# Patient Record
Sex: Male | Born: 1980 | Race: White | Hispanic: No | Marital: Single | Smoking: Current every day smoker
Health system: Southern US, Community
[De-identification: ages and names within clinical notes are randomized; demographics above are authoritative.]

## PROBLEM LIST (undated history)

## (undated) DIAGNOSIS — G959 Disease of spinal cord, unspecified: Secondary | ICD-10-CM

## (undated) HISTORY — DX: Disease of spinal cord, unspecified: G95.9

## (undated) HISTORY — PX: INNER EAR SURGERY: SHX679

---

## 2010-10-24 HISTORY — PX: OTHER SURGICAL HISTORY: SHX169

## 2011-03-14 ENCOUNTER — Emergency Department (HOSPITAL_COMMUNITY): Payer: Medicaid Other

## 2011-03-14 ENCOUNTER — Inpatient Hospital Stay (HOSPITAL_COMMUNITY)
Admission: EM | Admit: 2011-03-14 | Discharge: 2011-04-06 | DRG: 003 | Disposition: A | Discharge status: Rehabilitation Facility | Payer: Medicaid Other | Attending: General Surgery | Admitting: General Surgery

## 2011-03-14 DIAGNOSIS — S27329A Contusion of lung, unspecified, initial encounter: Secondary | ICD-10-CM | POA: Diagnosis present

## 2011-03-14 DIAGNOSIS — S12600A Unspecified displaced fracture of seventh cervical vertebra, initial encounter for closed fracture: Secondary | ICD-10-CM | POA: Diagnosis present

## 2011-03-14 DIAGNOSIS — S0280XA Fracture of other specified skull and facial bones, unspecified side, initial encounter for closed fracture: Secondary | ICD-10-CM | POA: Diagnosis present

## 2011-03-14 DIAGNOSIS — J95821 Acute postprocedural respiratory failure: Secondary | ICD-10-CM | POA: Diagnosis present

## 2011-03-14 DIAGNOSIS — Z781 Physical restraint status: Secondary | ICD-10-CM | POA: Diagnosis not present

## 2011-03-14 DIAGNOSIS — S02400A Malar fracture unspecified, initial encounter for closed fracture: Secondary | ICD-10-CM | POA: Diagnosis present

## 2011-03-14 DIAGNOSIS — J15211 Pneumonia due to Methicillin susceptible Staphylococcus aureus: Secondary | ICD-10-CM | POA: Diagnosis not present

## 2011-03-14 DIAGNOSIS — S02609A Fracture of mandible, unspecified, initial encounter for closed fracture: Secondary | ICD-10-CM | POA: Diagnosis present

## 2011-03-14 DIAGNOSIS — S0230XA Fracture of orbital floor, unspecified side, initial encounter for closed fracture: Secondary | ICD-10-CM | POA: Diagnosis present

## 2011-03-14 DIAGNOSIS — S022XXA Fracture of nasal bones, initial encounter for closed fracture: Secondary | ICD-10-CM | POA: Diagnosis present

## 2011-03-14 DIAGNOSIS — Y998 Other external cause status: Secondary | ICD-10-CM

## 2011-03-14 DIAGNOSIS — R1310 Dysphagia, unspecified: Secondary | ICD-10-CM | POA: Diagnosis not present

## 2011-03-14 DIAGNOSIS — S02109A Fracture of base of skull, unspecified side, initial encounter for closed fracture: Principal | ICD-10-CM | POA: Diagnosis present

## 2011-03-14 DIAGNOSIS — E876 Hypokalemia: Secondary | ICD-10-CM | POA: Diagnosis not present

## 2011-03-14 DIAGNOSIS — F17200 Nicotine dependence, unspecified, uncomplicated: Secondary | ICD-10-CM | POA: Diagnosis present

## 2011-03-14 DIAGNOSIS — S02401A Maxillary fracture, unspecified, initial encounter for closed fracture: Secondary | ICD-10-CM | POA: Diagnosis present

## 2011-03-14 DIAGNOSIS — D62 Acute posthemorrhagic anemia: Secondary | ICD-10-CM | POA: Diagnosis present

## 2011-03-14 DIAGNOSIS — R561 Post traumatic seizures: Secondary | ICD-10-CM | POA: Diagnosis present

## 2011-03-14 LAB — COMPREHENSIVE METABOLIC PANEL
ALT: 51 U/L (ref 0–53)
AST: 45 U/L — ABNORMAL HIGH (ref 0–37)
Albumin: 4 g/dL (ref 3.5–5.2)
Alkaline Phosphatase: 73 U/L (ref 39–117)
BUN: 17 mg/dL (ref 6–23)
CO2: 24 meq/L (ref 19–32)
Calcium: 8.9 mg/dL (ref 8.4–10.5)
Chloride: 103 meq/L (ref 96–112)
Creatinine, Ser: 1.13 mg/dL (ref 0.4–1.5)
Glucose, Bld: 203 mg/dL — ABNORMAL HIGH (ref 70–99)
Potassium: 2.7 meq/L — CL (ref 3.5–5.1)
Sodium: 140 meq/L (ref 135–145)
Total Bilirubin: 0.2 mg/dL — ABNORMAL LOW (ref 0.3–1.2)
Total Protein: 6.7 g/dL (ref 6.0–8.3)

## 2011-03-14 LAB — CBC
HCT: 42.3 % (ref 39.0–52.0)
Hemoglobin: 15.1 g/dL (ref 13.0–17.0)
MCV: 86.2 fL (ref 78.0–100.0)
RBC: 4.91 MIL/uL (ref 4.22–5.81)
WBC: 20.8 10*3/uL — ABNORMAL HIGH (ref 4.0–10.5)

## 2011-03-14 LAB — POCT I-STAT, CHEM 8
BUN: 17 mg/dL (ref 6–23)
Calcium, Ion: 1.06 mmol/L — ABNORMAL LOW (ref 1.12–1.32)
Chloride: 105 meq/L (ref 96–112)
HCT: 44 % (ref 39.0–52.0)
Potassium: 2.9 meq/L — ABNORMAL LOW (ref 3.5–5.1)

## 2011-03-14 LAB — PROTIME-INR
INR: 1.05 (ref 0.00–1.49)
Prothrombin Time: 13.9 s (ref 11.6–15.2)

## 2011-03-14 LAB — POCT I-STAT 3, ART BLOOD GAS (G3+)
Acid-base deficit: 2 mmol/L (ref 0.0–2.0)
Bicarbonate: 23.5 meq/L (ref 20.0–24.0)
O2 Saturation: 80 %
TCO2: 25 mmol/L (ref 0–100)
pCO2 arterial: 43.3 mmHg (ref 35.0–45.0)
pH, Arterial: 7.343 — ABNORMAL LOW (ref 7.350–7.450)
pO2, Arterial: 47 mmHg — ABNORMAL LOW (ref 80.0–100.0)

## 2011-03-14 LAB — ABO/RH: ABO/RH(D): O POS

## 2011-03-14 LAB — LACTIC ACID, PLASMA: Lactic Acid, Venous: 2.9 mmol/L — ABNORMAL HIGH (ref 0.5–2.2)

## 2011-03-14 MED ORDER — IOHEXOL 300 MG/ML  SOLN
100.0000 mL | Freq: Once | INTRAMUSCULAR | Status: AC | PRN
  Administered 2011-03-14: 100 mL via INTRAVENOUS

## 2011-03-14 MED ORDER — IOHEXOL 350 MG/ML SOLN
50.0000 mL | Freq: Once | INTRAVENOUS | Status: AC | PRN
  Administered 2011-03-14: 50 mL via INTRAVENOUS

## 2011-03-15 ENCOUNTER — Inpatient Hospital Stay (HOSPITAL_COMMUNITY): Payer: Medicaid Other

## 2011-03-15 LAB — POCT I-STAT 3, ART BLOOD GAS (G3+)
Acid-base deficit: 1 mmol/L (ref 0.0–2.0)
Bicarbonate: 25.1 meq/L — ABNORMAL HIGH (ref 20.0–24.0)
O2 Saturation: 100 %
Patient temperature: 38.2
TCO2: 26 mmol/L (ref 0–100)
pCO2 arterial: 48.4 mmHg — ABNORMAL HIGH (ref 35.0–45.0)
pH, Arterial: 7.328 — ABNORMAL LOW (ref 7.350–7.450)
pO2, Arterial: 353 mmHg — ABNORMAL HIGH (ref 80.0–100.0)

## 2011-03-15 LAB — BASIC METABOLIC PANEL
CO2: 24 mEq/L (ref 19–32)
Calcium: 8.6 mg/dL (ref 8.4–10.5)
Creatinine, Ser: 0.83 mg/dL (ref 0.4–1.5)
Glucose, Bld: 129 mg/dL — ABNORMAL HIGH (ref 70–99)

## 2011-03-15 LAB — TYPE AND SCREEN
ABO/RH(D): O POS
Antibody Screen: NEGATIVE
Unit division: 0
Unit division: 0

## 2011-03-15 LAB — CBC
HCT: 38.8 % — ABNORMAL LOW (ref 39.0–52.0)
Hemoglobin: 13.6 g/dL (ref 13.0–17.0)
MCH: 30.5 pg (ref 26.0–34.0)
MCHC: 35.1 g/dL (ref 30.0–36.0)
RDW: 12.2 % (ref 11.5–15.5)

## 2011-03-15 LAB — GLUCOSE, CAPILLARY: Glucose-Capillary: 169 mg/dL — ABNORMAL HIGH (ref 70–99)

## 2011-03-16 LAB — POCT I-STAT 3, ART BLOOD GAS (G3+)
Bicarbonate: 25.9 meq/L — ABNORMAL HIGH (ref 20.0–24.0)
O2 Saturation: 99 %
Patient temperature: 37.2
TCO2: 27 mmol/L (ref 0–100)
pO2, Arterial: 126 mmHg — ABNORMAL HIGH (ref 80.0–100.0)

## 2011-03-16 LAB — DIFFERENTIAL
Eosinophils Absolute: 0 10*3/uL (ref 0.0–0.7)
Eosinophils Relative: 0 % (ref 0–5)
Lymphs Abs: 0.7 10*3/uL (ref 0.7–4.0)
Monocytes Absolute: 0.7 10*3/uL (ref 0.1–1.0)
Monocytes Relative: 5 % (ref 3–12)

## 2011-03-16 LAB — CBC
MCH: 30.2 pg (ref 26.0–34.0)
MCV: 87.3 fL (ref 78.0–100.0)
Platelets: 143 10*3/uL — ABNORMAL LOW (ref 150–400)
RBC: 4.24 MIL/uL (ref 4.22–5.81)
RDW: 12.1 % (ref 11.5–15.5)

## 2011-03-16 LAB — BASIC METABOLIC PANEL
BUN: 11 mg/dL (ref 6–23)
Chloride: 100 mEq/L (ref 96–112)
Creatinine, Ser: 0.64 mg/dL (ref 0.4–1.5)

## 2011-03-17 ENCOUNTER — Inpatient Hospital Stay (HOSPITAL_COMMUNITY): Payer: Medicaid Other

## 2011-03-17 LAB — BASIC METABOLIC PANEL
CO2: 27 mEq/L (ref 19–32)
Chloride: 96 mEq/L (ref 96–112)
Sodium: 132 mEq/L — ABNORMAL LOW (ref 135–145)

## 2011-03-17 LAB — CBC
Hemoglobin: 11.9 g/dL — ABNORMAL LOW (ref 13.0–17.0)
RBC: 3.9 MIL/uL — ABNORMAL LOW (ref 4.22–5.81)
WBC: 10.7 10*3/uL — ABNORMAL HIGH (ref 4.0–10.5)

## 2011-03-17 LAB — DIFFERENTIAL
Basophils Absolute: 0 10*3/uL (ref 0.0–0.1)
Basophils Relative: 0 % (ref 0–1)
Monocytes Relative: 7 % (ref 3–12)
Neutro Abs: 9.2 10*3/uL — ABNORMAL HIGH (ref 1.7–7.7)
Neutrophils Relative %: 86 % — ABNORMAL HIGH (ref 43–77)

## 2011-03-17 LAB — BLOOD GAS, ARTERIAL
Acid-Base Excess: 2.6 mmol/L — ABNORMAL HIGH (ref 0.0–2.0)
Drawn by: 31276
FIO2: 0.4 %
RATE: 15 resp/min
pCO2 arterial: 47 mmHg — ABNORMAL HIGH (ref 35.0–45.0)
pO2, Arterial: 110 mmHg — ABNORMAL HIGH (ref 80.0–100.0)

## 2011-03-18 ENCOUNTER — Inpatient Hospital Stay (HOSPITAL_COMMUNITY): Payer: Medicaid Other

## 2011-03-18 LAB — BASIC METABOLIC PANEL
CO2: 30 mEq/L (ref 19–32)
Calcium: 8.1 mg/dL — ABNORMAL LOW (ref 8.4–10.5)
Chloride: 98 mEq/L (ref 96–112)
Glucose, Bld: 131 mg/dL — ABNORMAL HIGH (ref 70–99)
Sodium: 136 mEq/L (ref 135–145)

## 2011-03-18 LAB — CBC
HCT: 32.6 % — ABNORMAL LOW (ref 39.0–52.0)
Hemoglobin: 11.2 g/dL — ABNORMAL LOW (ref 13.0–17.0)
MCH: 30.2 pg (ref 26.0–34.0)
MCHC: 34.4 g/dL (ref 30.0–36.0)
RBC: 3.71 MIL/uL — ABNORMAL LOW (ref 4.22–5.81)

## 2011-03-18 LAB — POCT I-STAT 3, ART BLOOD GAS (G3+)
Bicarbonate: 28 meq/L — ABNORMAL HIGH (ref 20.0–24.0)
O2 Saturation: 98 %
TCO2: 29 mmol/L (ref 0–100)
pCO2 arterial: 43.8 mmHg (ref 35.0–45.0)

## 2011-03-18 LAB — DIFFERENTIAL
Basophils Absolute: 0 10*3/uL (ref 0.0–0.1)
Lymphocytes Relative: 7 % — ABNORMAL LOW (ref 12–46)
Monocytes Absolute: 0.7 10*3/uL (ref 0.1–1.0)
Monocytes Relative: 9 % (ref 3–12)
Neutro Abs: 6.6 10*3/uL (ref 1.7–7.7)
Neutrophils Relative %: 84 % — ABNORMAL HIGH (ref 43–77)

## 2011-03-18 LAB — GLUCOSE, CAPILLARY
Glucose-Capillary: 126 mg/dL — ABNORMAL HIGH (ref 70–99)
Glucose-Capillary: 135 mg/dL — ABNORMAL HIGH (ref 70–99)

## 2011-03-18 LAB — MAGNESIUM: Magnesium: 2.3 mg/dL (ref 1.5–2.5)

## 2011-03-19 LAB — PHOSPHORUS: Phosphorus: 1.6 mg/dL — ABNORMAL LOW (ref 2.3–4.6)

## 2011-03-19 LAB — COMPREHENSIVE METABOLIC PANEL
AST: 22 U/L (ref 0–37)
CO2: 28 mEq/L (ref 19–32)
Calcium: 8.2 mg/dL — ABNORMAL LOW (ref 8.4–10.5)
Creatinine, Ser: 0.5 mg/dL (ref 0.4–1.5)
GFR calc Af Amer: >60 mL/min (ref >60)
GFR calc non Af Amer: >60 mL/min (ref >60)

## 2011-03-19 LAB — DIFFERENTIAL
Basophils Relative: 0 % (ref 0–1)
Eosinophils Absolute: 0.2 10*3/uL (ref 0.0–0.7)
Monocytes Absolute: 0.7 10*3/uL (ref 0.1–1.0)
Monocytes Relative: 9 % (ref 3–12)

## 2011-03-19 LAB — CULTURE, RESPIRATORY W GRAM STAIN

## 2011-03-19 LAB — CBC
Hemoglobin: 9.7 g/dL — ABNORMAL LOW (ref 13.0–17.0)
MCH: 30.4 pg (ref 26.0–34.0)
MCHC: 34.5 g/dL (ref 30.0–36.0)
Platelets: 169 10*3/uL (ref 150–400)

## 2011-03-19 LAB — MAGNESIUM: Magnesium: 2.4 mg/dL (ref 1.5–2.5)

## 2011-03-19 LAB — PREALBUMIN: Prealbumin: 8 mg/dL — ABNORMAL LOW (ref 17.0–34.0)

## 2011-03-19 LAB — TRIGLYCERIDES: Triglycerides: 83 mg/dL (ref <150)

## 2011-03-20 ENCOUNTER — Inpatient Hospital Stay (HOSPITAL_COMMUNITY): Payer: Medicaid Other

## 2011-03-20 LAB — CBC
HCT: 30 % — ABNORMAL LOW (ref 39.0–52.0)
Hemoglobin: 10.3 g/dL — ABNORMAL LOW (ref 13.0–17.0)
RBC: 3.48 MIL/uL — ABNORMAL LOW (ref 4.22–5.81)
WBC: 7.5 10*3/uL (ref 4.0–10.5)

## 2011-03-20 LAB — BASIC METABOLIC PANEL
CO2: 28 mEq/L (ref 19–32)
Chloride: 104 mEq/L (ref 96–112)
Potassium: 3.2 mEq/L — ABNORMAL LOW (ref 3.5–5.1)
Sodium: 139 mEq/L (ref 135–145)

## 2011-03-20 LAB — GLUCOSE, CAPILLARY
Glucose-Capillary: 131 mg/dL — ABNORMAL HIGH (ref 70–99)
Glucose-Capillary: 115 mg/dL — ABNORMAL HIGH (ref 70–99)

## 2011-03-20 LAB — PHOSPHORUS: Phosphorus: 3.7 mg/dL (ref 2.3–4.6)

## 2011-03-21 ENCOUNTER — Inpatient Hospital Stay (HOSPITAL_COMMUNITY): Payer: Medicaid Other

## 2011-03-21 LAB — COMPREHENSIVE METABOLIC PANEL
AST: 45 U/L — ABNORMAL HIGH (ref 0–37)
Albumin: 2.9 g/dL — ABNORMAL LOW (ref 3.5–5.2)
BUN: 13 mg/dL (ref 6–23)
Calcium: 9.1 mg/dL (ref 8.4–10.5)
Creatinine, Ser: 0.49 mg/dL (ref 0.4–1.5)
GFR calc Af Amer: >60 mL/min (ref >60)
GFR calc non Af Amer: >60 mL/min (ref >60)

## 2011-03-21 LAB — DIFFERENTIAL
Basophils Relative: 0 % (ref 0–1)
Eosinophils Absolute: 0.2 10*3/uL (ref 0.0–0.7)
Eosinophils Relative: 3 % (ref 0–5)
Monocytes Absolute: 1 10*3/uL (ref 0.1–1.0)
Monocytes Relative: 11 % (ref 3–12)

## 2011-03-21 LAB — GLUCOSE, CAPILLARY
Glucose-Capillary: 128 mg/dL — ABNORMAL HIGH (ref 70–99)
Glucose-Capillary: 137 mg/dL — ABNORMAL HIGH (ref 70–99)
Glucose-Capillary: 114 mg/dL — ABNORMAL HIGH (ref 70–99)
Glucose-Capillary: 149 mg/dL — ABNORMAL HIGH (ref 70–99)

## 2011-03-21 LAB — CBC
MCH: 30.3 pg (ref 26.0–34.0)
MCHC: 35.4 g/dL (ref 30.0–36.0)
MCV: 85.6 fL (ref 78.0–100.0)
RDW: 11.6 % (ref 11.5–15.5)

## 2011-03-21 LAB — PHOSPHORUS: Phosphorus: 5.5 mg/dL — ABNORMAL HIGH (ref 2.3–4.6)

## 2011-03-22 ENCOUNTER — Inpatient Hospital Stay (HOSPITAL_COMMUNITY): Payer: Medicaid Other

## 2011-03-22 LAB — POCT I-STAT 4, (NA,K, GLUC, HGB,HCT)
Potassium: 4.3 meq/L (ref 3.5–5.1)
Sodium: 136 meq/L (ref 135–145)

## 2011-03-22 LAB — BASIC METABOLIC PANEL
CO2: 28 mEq/L (ref 19–32)
Calcium: 9.1 mg/dL (ref 8.4–10.5)
Chloride: 99 mEq/L (ref 96–112)
GFR calc Af Amer: >60 mL/min (ref >60)
Glucose, Bld: 165 mg/dL — ABNORMAL HIGH (ref 70–99)
Potassium: 4.2 mEq/L (ref 3.5–5.1)
Sodium: 136 mEq/L (ref 135–145)

## 2011-03-22 LAB — CBC
HCT: 36.4 % — ABNORMAL LOW (ref 39.0–52.0)
Hemoglobin: 12.8 g/dL — ABNORMAL LOW (ref 13.0–17.0)
MCHC: 35.2 g/dL (ref 30.0–36.0)
RBC: 4.29 MIL/uL (ref 4.22–5.81)

## 2011-03-22 LAB — GLUCOSE, CAPILLARY
Glucose-Capillary: 152 mg/dL — ABNORMAL HIGH (ref 70–99)
Glucose-Capillary: 159 mg/dL — ABNORMAL HIGH (ref 70–99)

## 2011-03-22 LAB — PHOSPHORUS: Phosphorus: 4.4 mg/dL (ref 2.3–4.6)

## 2011-03-23 LAB — CBC
MCH: 30.3 pg (ref 26.0–34.0)
MCHC: 35.4 g/dL (ref 30.0–36.0)
MCV: 85.6 fL (ref 78.0–100.0)
Platelets: 417 10*3/uL — ABNORMAL HIGH (ref 150–400)
RBC: 4.32 MIL/uL (ref 4.22–5.81)

## 2011-03-23 LAB — GLUCOSE, CAPILLARY
Glucose-Capillary: 147 mg/dL — ABNORMAL HIGH (ref 70–99)
Glucose-Capillary: 150 mg/dL — ABNORMAL HIGH (ref 70–99)
Glucose-Capillary: 155 mg/dL — ABNORMAL HIGH (ref 70–99)

## 2011-03-23 LAB — PHOSPHORUS: Phosphorus: 4.2 mg/dL (ref 2.3–4.6)

## 2011-03-23 LAB — MAGNESIUM: Magnesium: 2.5 mg/dL (ref 1.5–2.5)

## 2011-03-23 LAB — BASIC METABOLIC PANEL
BUN: 19 mg/dL (ref 6–23)
Calcium: 9.1 mg/dL (ref 8.4–10.5)
Chloride: 97 mEq/L (ref 96–112)
Creatinine, Ser: 0.55 mg/dL (ref 0.4–1.5)
GFR calc Af Amer: >60 mL/min (ref >60)
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
## 2011-03-24 LAB — COMPREHENSIVE METABOLIC PANEL
AST: 45 U/L — ABNORMAL HIGH (ref 0–37)
Albumin: 3.4 g/dL — ABNORMAL LOW (ref 3.5–5.2)
BUN: 20 mg/dL (ref 6–23)
Chloride: 99 mEq/L (ref 96–112)
Creatinine, Ser: 0.66 mg/dL (ref 0.4–1.5)
GFR calc Af Amer: >60 mL/min (ref >60)
GFR calc non Af Amer: >60 mL/min (ref >60)
Potassium: 3.8 mEq/L (ref 3.5–5.1)
Total Protein: 7.1 g/dL (ref 6.0–8.3)

## 2011-03-24 LAB — DIFFERENTIAL
Basophils Relative: 0 % (ref 0–1)
Eosinophils Absolute: 0.2 10*3/uL (ref 0.0–0.7)
Eosinophils Relative: 1 % (ref 0–5)
Neutrophils Relative %: 73 % (ref 43–77)

## 2011-03-24 LAB — CBC
MCH: 29.8 pg (ref 26.0–34.0)
MCV: 85.7 fL (ref 78.0–100.0)
Platelets: 502 10*3/uL — ABNORMAL HIGH (ref 150–400)
RBC: 4.2 MIL/uL — ABNORMAL LOW (ref 4.22–5.81)
RDW: 11.8 % (ref 11.5–15.5)
WBC: 14.6 10*3/uL — ABNORMAL HIGH (ref 4.0–10.5)

## 2011-03-24 LAB — MAGNESIUM: Magnesium: 2.5 mg/dL (ref 1.5–2.5)

## 2011-03-24 LAB — GLUCOSE, CAPILLARY

## 2011-03-25 DIAGNOSIS — S069X9A Unspecified intracranial injury with loss of consciousness of unspecified duration, initial encounter: Secondary | ICD-10-CM

## 2011-03-25 LAB — GLUCOSE, CAPILLARY
Glucose-Capillary: 152 mg/dL — ABNORMAL HIGH (ref 70–99)
Glucose-Capillary: 152 mg/dL — ABNORMAL HIGH (ref 70–99)

## 2011-03-26 LAB — GLUCOSE, CAPILLARY
Glucose-Capillary: 133 mg/dL — ABNORMAL HIGH (ref 70–99)
Glucose-Capillary: 135 mg/dL — ABNORMAL HIGH (ref 70–99)

## 2011-03-26 LAB — BASIC METABOLIC PANEL
CO2: 29 mEq/L (ref 19–32)
Glucose, Bld: 166 mg/dL — ABNORMAL HIGH (ref 70–99)
Potassium: 3.7 mEq/L (ref 3.5–5.1)
Sodium: 139 mEq/L (ref 135–145)

## 2011-03-27 LAB — GLUCOSE, CAPILLARY

## 2011-03-28 LAB — COMPREHENSIVE METABOLIC PANEL
AST: 26 U/L (ref 0–37)
Albumin: 3.2 g/dL — ABNORMAL LOW (ref 3.5–5.2)
Chloride: 102 mEq/L (ref 96–112)
Creatinine, Ser: 0.58 mg/dL (ref 0.4–1.5)
GFR calc Af Amer: >60 mL/min (ref >60)
Total Bilirubin: 0.5 mg/dL (ref 0.3–1.2)
Total Protein: 6.9 g/dL (ref 6.0–8.3)

## 2011-03-28 LAB — CBC
MCH: 30.4 pg (ref 26.0–34.0)
Platelets: 552 10*3/uL — ABNORMAL HIGH (ref 150–400)
RBC: 3.91 MIL/uL — ABNORMAL LOW (ref 4.22–5.81)
RDW: 12 % (ref 11.5–15.5)
WBC: 14.1 10*3/uL — ABNORMAL HIGH (ref 4.0–10.5)

## 2011-03-28 LAB — GLUCOSE, CAPILLARY: Glucose-Capillary: 126 mg/dL — ABNORMAL HIGH (ref 70–99)

## 2011-03-28 LAB — DIFFERENTIAL
Basophils Relative: 0 % (ref 0–1)
Eosinophils Absolute: 0.2 10*3/uL (ref 0.0–0.7)
Eosinophils Relative: 1 % (ref 0–5)
Monocytes Relative: 8 % (ref 3–12)
Neutrophils Relative %: 72 % (ref 43–77)

## 2011-03-31 LAB — DIFFERENTIAL
Basophils Absolute: 0 10*3/uL (ref 0.0–0.1)
Lymphocytes Relative: 16 % (ref 12–46)
Lymphs Abs: 1.9 10*3/uL (ref 0.7–4.0)
Monocytes Absolute: 0.9 10*3/uL (ref 0.1–1.0)
Monocytes Relative: 7 % (ref 3–12)
Neutro Abs: 9.4 10*3/uL — ABNORMAL HIGH (ref 1.7–7.7)

## 2011-03-31 LAB — CBC
HCT: 36.6 % — ABNORMAL LOW (ref 39.0–52.0)
Hemoglobin: 12.5 g/dL — ABNORMAL LOW (ref 13.0–17.0)
MCHC: 34.2 g/dL (ref 30.0–36.0)
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
## 2011-04-01 LAB — COMPREHENSIVE METABOLIC PANEL
ALT: 48 U/L (ref 0–53)
Alkaline Phosphatase: 114 U/L (ref 39–117)
CO2: 27 mEq/L (ref 19–32)
Calcium: 8.8 mg/dL (ref 8.4–10.5)
GFR calc non Af Amer: >60 mL/min (ref >60)
Glucose, Bld: 152 mg/dL — ABNORMAL HIGH (ref 70–99)
Potassium: 3.9 mEq/L (ref 3.5–5.1)
Sodium: 136 mEq/L (ref 135–145)

## 2011-04-01 LAB — MAGNESIUM: Magnesium: 2.3 mg/dL (ref 1.5–2.5)

## 2011-04-03 LAB — CBC
MCH: 31.1 pg (ref 26.0–34.0)
MCHC: 35.3 g/dL (ref 30.0–36.0)
Platelets: 461 10*3/uL — ABNORMAL HIGH (ref 150–400)
RDW: 12.4 % (ref 11.5–15.5)

## 2011-04-04 LAB — DIFFERENTIAL
Basophils Relative: 0 % (ref 0–1)
Eosinophils Absolute: 0.2 10*3/uL (ref 0.0–0.7)
Neutrophils Relative %: 71 % (ref 43–77)

## 2011-04-04 LAB — CBC
Platelets: 455 10*3/uL — ABNORMAL HIGH (ref 150–400)
RBC: 4.09 MIL/uL — ABNORMAL LOW (ref 4.22–5.81)
WBC: 10.1 10*3/uL (ref 4.0–10.5)

## 2011-04-04 LAB — COMPREHENSIVE METABOLIC PANEL
ALT: 53 U/L (ref 0–53)
AST: 28 U/L (ref 0–37)
Albumin: 3.3 g/dL — ABNORMAL LOW (ref 3.5–5.2)
CO2: 28 mEq/L (ref 19–32)
Calcium: 9.2 mg/dL (ref 8.4–10.5)
Chloride: 102 mEq/L (ref 96–112)
GFR calc non Af Amer: >60 mL/min (ref >60)
Sodium: 139 mEq/L (ref 135–145)
Total Bilirubin: 0.3 mg/dL (ref 0.3–1.2)

## 2011-04-04 LAB — CHOLESTEROL, TOTAL: Cholesterol: 87 mg/dL (ref 0–200)

## 2011-04-04 LAB — PREALBUMIN: Prealbumin: 29.4 mg/dL (ref 17.0–34.0)

## 2011-04-06 ENCOUNTER — Inpatient Hospital Stay (HOSPITAL_COMMUNITY)
Admission: RE | Admit: 2011-04-06 | Discharge: 2011-04-15 | DRG: 945 | Disposition: A | Discharge status: Home / Self Care | Payer: Medicaid Other | Source: Other Acute Inpatient Hospital | Attending: Physical Medicine & Rehabilitation | Admitting: Physical Medicine & Rehabilitation

## 2011-04-06 DIAGNOSIS — F17200 Nicotine dependence, unspecified, uncomplicated: Secondary | ICD-10-CM | POA: Diagnosis present

## 2011-04-06 DIAGNOSIS — R1310 Dysphagia, unspecified: Secondary | ICD-10-CM | POA: Diagnosis present

## 2011-04-06 DIAGNOSIS — Z5189 Encounter for other specified aftercare: Principal | ICD-10-CM

## 2011-04-06 DIAGNOSIS — S0280XA Fracture of other specified skull and facial bones, unspecified side, initial encounter for closed fracture: Secondary | ICD-10-CM

## 2011-04-06 DIAGNOSIS — Z930 Tracheostomy status: Secondary | ICD-10-CM

## 2011-04-06 DIAGNOSIS — S02609A Fracture of mandible, unspecified, initial encounter for closed fracture: Secondary | ICD-10-CM | POA: Diagnosis present

## 2011-04-06 DIAGNOSIS — S82009A Unspecified fracture of unspecified patella, initial encounter for closed fracture: Secondary | ICD-10-CM | POA: Diagnosis present

## 2011-04-06 DIAGNOSIS — S02401A Maxillary fracture, unspecified, initial encounter for closed fracture: Secondary | ICD-10-CM | POA: Diagnosis present

## 2011-04-06 DIAGNOSIS — Z781 Physical restraint status: Secondary | ICD-10-CM | POA: Diagnosis not present

## 2011-04-06 DIAGNOSIS — S0230XA Fracture of orbital floor, unspecified side, initial encounter for closed fracture: Secondary | ICD-10-CM | POA: Diagnosis present

## 2011-04-06 DIAGNOSIS — S02109A Fracture of base of skull, unspecified side, initial encounter for closed fracture: Secondary | ICD-10-CM | POA: Diagnosis present

## 2011-04-06 DIAGNOSIS — G4700 Insomnia, unspecified: Secondary | ICD-10-CM | POA: Diagnosis present

## 2011-04-06 DIAGNOSIS — S069X9A Unspecified intracranial injury with loss of consciousness of unspecified duration, initial encounter: Secondary | ICD-10-CM

## 2011-04-06 DIAGNOSIS — R110 Nausea: Secondary | ICD-10-CM | POA: Diagnosis present

## 2011-04-07 DIAGNOSIS — S0280XA Fracture of other specified skull and facial bones, unspecified side, initial encounter for closed fracture: Secondary | ICD-10-CM

## 2011-04-07 DIAGNOSIS — S069XAA Unspecified intracranial injury with loss of consciousness status unknown, initial encounter: Secondary | ICD-10-CM

## 2011-04-07 DIAGNOSIS — R1310 Dysphagia, unspecified: Secondary | ICD-10-CM

## 2011-04-07 DIAGNOSIS — S069X9A Unspecified intracranial injury with loss of consciousness of unspecified duration, initial encounter: Secondary | ICD-10-CM

## 2011-04-07 LAB — URINALYSIS, ROUTINE W REFLEX MICROSCOPIC
Hgb urine dipstick: NEGATIVE
Ketones, ur: NEGATIVE mg/dL
Nitrite: NEGATIVE
Urobilinogen, UA: 1 mg/dL (ref 0.0–1.0)

## 2011-04-07 LAB — COMPREHENSIVE METABOLIC PANEL
ALT: 61 U/L — ABNORMAL HIGH (ref 0–53)
AST: 30 U/L (ref 0–37)
Alkaline Phosphatase: 122 U/L — ABNORMAL HIGH (ref 39–117)
CO2: 30 mEq/L (ref 19–32)
Chloride: 99 mEq/L (ref 96–112)
GFR calc Af Amer: >60 mL/min (ref >60)
GFR calc non Af Amer: >60 mL/min (ref >60)
Glucose, Bld: 93 mg/dL (ref 70–99)
Potassium: 3.7 mEq/L (ref 3.5–5.1)
Sodium: 138 mEq/L (ref 135–145)
Total Bilirubin: 0.8 mg/dL (ref 0.3–1.2)

## 2011-04-07 LAB — DIFFERENTIAL
Basophils Absolute: 0 10*3/uL (ref 0.0–0.1)
Basophils Relative: 0 % (ref 0–1)
Monocytes Relative: 7 % (ref 3–12)
Neutro Abs: 4.3 10*3/uL (ref 1.7–7.7)
Neutrophils Relative %: 57 % (ref 43–77)

## 2011-04-07 LAB — CBC
Hemoglobin: 12.7 g/dL — ABNORMAL LOW (ref 13.0–17.0)
MCH: 30.4 pg (ref 26.0–34.0)
RBC: 4.18 MIL/uL — ABNORMAL LOW (ref 4.22–5.81)
WBC: 7.5 10*3/uL (ref 4.0–10.5)

## 2011-04-08 DIAGNOSIS — S069X9A Unspecified intracranial injury with loss of consciousness of unspecified duration, initial encounter: Secondary | ICD-10-CM

## 2011-04-08 DIAGNOSIS — S0280XA Fracture of other specified skull and facial bones, unspecified side, initial encounter for closed fracture: Secondary | ICD-10-CM

## 2011-04-08 DIAGNOSIS — R1310 Dysphagia, unspecified: Secondary | ICD-10-CM

## 2011-04-09 LAB — URINE CULTURE: Culture: NO GROWTH

## 2011-04-11 ENCOUNTER — Inpatient Hospital Stay (HOSPITAL_COMMUNITY): Payer: Medicaid Other

## 2011-04-11 DIAGNOSIS — S069X9A Unspecified intracranial injury with loss of consciousness of unspecified duration, initial encounter: Secondary | ICD-10-CM

## 2011-04-11 DIAGNOSIS — R1310 Dysphagia, unspecified: Secondary | ICD-10-CM

## 2011-04-11 DIAGNOSIS — S0280XA Fracture of other specified skull and facial bones, unspecified side, initial encounter for closed fracture: Secondary | ICD-10-CM

## 2011-04-11 LAB — BASIC METABOLIC PANEL
CO2: 29 mEq/L (ref 19–32)
Calcium: 9.2 mg/dL (ref 8.4–10.5)
Chloride: 102 mEq/L (ref 96–112)
Glucose, Bld: 130 mg/dL — ABNORMAL HIGH (ref 70–99)
Sodium: 139 mEq/L (ref 135–145)

## 2011-04-11 LAB — CBC
Hemoglobin: 12.3 g/dL — ABNORMAL LOW (ref 13.0–17.0)
MCH: 30.4 pg (ref 26.0–34.0)
RBC: 4.05 MIL/uL — ABNORMAL LOW (ref 4.22–5.81)
WBC: 4.8 10*3/uL (ref 4.0–10.5)

## 2011-04-13 DIAGNOSIS — S0280XA Fracture of other specified skull and facial bones, unspecified side, initial encounter for closed fracture: Secondary | ICD-10-CM

## 2011-04-13 DIAGNOSIS — S069X9A Unspecified intracranial injury with loss of consciousness of unspecified duration, initial encounter: Secondary | ICD-10-CM

## 2011-04-13 DIAGNOSIS — R1310 Dysphagia, unspecified: Secondary | ICD-10-CM

## 2011-04-18 ENCOUNTER — Ambulatory Visit: Payer: Medicaid Other

## 2011-04-18 ENCOUNTER — Ambulatory Visit: Payer: Medicaid Other | Attending: Physical Medicine & Rehabilitation | Admitting: Physical Therapy

## 2011-04-18 ENCOUNTER — Ambulatory Visit: Payer: Medicaid Other | Admitting: Occupational Therapy

## 2011-04-18 DIAGNOSIS — R5381 Other malaise: Secondary | ICD-10-CM | POA: Insufficient documentation

## 2011-04-18 DIAGNOSIS — R4189 Other symptoms and signs involving cognitive functions and awareness: Secondary | ICD-10-CM | POA: Insufficient documentation

## 2011-04-18 DIAGNOSIS — R279 Unspecified lack of coordination: Secondary | ICD-10-CM | POA: Insufficient documentation

## 2011-04-18 DIAGNOSIS — Z5189 Encounter for other specified aftercare: Secondary | ICD-10-CM | POA: Insufficient documentation

## 2011-04-25 ENCOUNTER — Ambulatory Visit: Payer: Medicaid Other | Attending: Physical Medicine & Rehabilitation | Admitting: Physical Therapy

## 2011-04-25 DIAGNOSIS — R5381 Other malaise: Secondary | ICD-10-CM | POA: Insufficient documentation

## 2011-04-25 DIAGNOSIS — R279 Unspecified lack of coordination: Secondary | ICD-10-CM | POA: Insufficient documentation

## 2011-04-25 DIAGNOSIS — Z5189 Encounter for other specified aftercare: Secondary | ICD-10-CM | POA: Insufficient documentation

## 2011-04-25 DIAGNOSIS — R4189 Other symptoms and signs involving cognitive functions and awareness: Secondary | ICD-10-CM | POA: Insufficient documentation
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
## 2011-04-29 ENCOUNTER — Ambulatory Visit: Payer: Medicaid Other

## 2011-04-29 ENCOUNTER — Ambulatory Visit: Payer: Medicaid Other | Admitting: Physical Therapy
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
## 2011-05-04 ENCOUNTER — Ambulatory Visit: Payer: Medicaid Other

## 2011-05-04 ENCOUNTER — Ambulatory Visit: Payer: Medicaid Other | Admitting: Physical Therapy

## 2011-05-04 ENCOUNTER — Ambulatory Visit: Payer: Medicaid Other | Admitting: Occupational Therapy

## 2011-05-05 ENCOUNTER — Ambulatory Visit: Payer: Self-pay | Admitting: Neurosurgery

## 2011-05-05 ENCOUNTER — Encounter: Payer: Medicaid Other | Attending: Neurosurgery | Admitting: Neurosurgery

## 2011-05-05 DIAGNOSIS — S069X9A Unspecified intracranial injury with loss of consciousness of unspecified duration, initial encounter: Secondary | ICD-10-CM

## 2011-05-05 DIAGNOSIS — X58XXXA Exposure to other specified factors, initial encounter: Secondary | ICD-10-CM | POA: Insufficient documentation

## 2011-05-05 DIAGNOSIS — Z79899 Other long term (current) drug therapy: Secondary | ICD-10-CM | POA: Insufficient documentation

## 2011-05-05 DIAGNOSIS — S069XAA Unspecified intracranial injury with loss of consciousness status unknown, initial encounter: Secondary | ICD-10-CM | POA: Insufficient documentation

## 2011-05-06 ENCOUNTER — Ambulatory Visit: Payer: Medicaid Other | Admitting: Occupational Therapy

## 2011-05-06 ENCOUNTER — Ambulatory Visit: Payer: Medicaid Other

## 2011-05-06 ENCOUNTER — Ambulatory Visit: Payer: Medicaid Other | Admitting: Physical Therapy
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
## 2011-05-11 ENCOUNTER — Ambulatory Visit: Payer: Medicaid Other | Admitting: Occupational Therapy

## 2011-05-11 ENCOUNTER — Ambulatory Visit: Payer: Medicaid Other

## 2011-05-11 ENCOUNTER — Ambulatory Visit: Payer: Medicaid Other | Admitting: *Deleted

## 2011-05-13 ENCOUNTER — Encounter: Payer: Self-pay | Admitting: Occupational Therapy

## 2011-05-13 ENCOUNTER — Ambulatory Visit: Payer: Self-pay | Admitting: Physical Therapy
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
## 2011-05-16 ENCOUNTER — Ambulatory Visit: Payer: Medicaid Other | Admitting: *Deleted

## 2011-05-16 ENCOUNTER — Other Ambulatory Visit: Payer: Self-pay | Admitting: Physical Medicine & Rehabilitation

## 2011-05-16 ENCOUNTER — Ambulatory Visit (HOSPITAL_COMMUNITY)
Admission: RE | Admit: 2011-05-16 | Discharge: 2011-05-16 | Disposition: A | Discharge status: Home / Self Care | Payer: Medicaid Other | Source: Ambulatory Visit | Attending: Physical Medicine & Rehabilitation | Admitting: Physical Medicine & Rehabilitation

## 2011-05-16 ENCOUNTER — Encounter (HOSPITAL_BASED_OUTPATIENT_CLINIC_OR_DEPARTMENT_OTHER): Payer: Medicaid Other | Admitting: Physical Medicine & Rehabilitation

## 2011-05-16 ENCOUNTER — Ambulatory Visit: Payer: Self-pay | Admitting: Physical Therapy

## 2011-05-16 ENCOUNTER — Ambulatory Visit: Payer: Medicaid Other | Admitting: Occupational Therapy

## 2011-05-16 DIAGNOSIS — G919 Hydrocephalus, unspecified: Secondary | ICD-10-CM

## 2011-05-16 DIAGNOSIS — G911 Obstructive hydrocephalus: Secondary | ICD-10-CM | POA: Insufficient documentation

## 2011-05-16 DIAGNOSIS — S069XAA Unspecified intracranial injury with loss of consciousness status unknown, initial encounter: Secondary | ICD-10-CM

## 2011-05-16 DIAGNOSIS — S069X9A Unspecified intracranial injury with loss of consciousness of unspecified duration, initial encounter: Secondary | ICD-10-CM

## 2011-05-16 DIAGNOSIS — F3289 Other specified depressive episodes: Secondary | ICD-10-CM

## 2011-05-16 DIAGNOSIS — F329 Major depressive disorder, single episode, unspecified: Secondary | ICD-10-CM

## 2011-05-17 NOTE — Assessment & Plan Note (Signed)
This is a work-in for Gianni as his wife notes increased behavioral issues since his job hour was removed.  The removal was actually on May 10, 2011, and she states that he had been doing extremely well up until that.  Since then, he has had decreased mood, where he has been quite depressed and not wanting to get out of bed, liking any motivation.  He does not want to go to the doctor's office or the therapy either.  He has had some episodes where he blanks out and will not respond for several minutes.  I asked Camran if he recalls these episodes and his cognition of what is going around and he says yes that he does recall being in his own and he almost feels that as if he is in a bit of a dream and has a difficulty responding.  There has been no other symptoms associated with this.  He denies any postictal type of problems.  Wife does note some worsening balance.  She notes that therapy noticed that also.  He denies pain.  He has been eating very well.  REVIEW OF SYSTEMS:  Notable for the above.  Full 12-point review is in the written health and history section of the chart.  SOCIAL HISTORY:  The patient is married and wife is with him today.  PHYSICAL EXAMINATION:  VITAL SIGNS:  Blood pressure is 116/82, pulse 75, respiratory rate 18 and he is satting 100% on room air. GENERAL:  The patient is flat, does make eye contact. EXTREMITIES:  Gait is slightly unsteady, but he seems to catch himself most of the time and does not seem to be at major risk for falling at least for me today.  He had minimally positive Romberg.  No obvious pronator drift.  Strength seemed to be generally symmetrical. NEUROLOGIC:  Cognitively, he seemed to have fair attention.  The awareness was limited as was insight.  He lacks much motivation.  He is able to follow simple commands for me today.  I had him walk back into the office and he sat down and refused to stand up for me again initially needing quite a  bit of coaxing by the wife.  I asked him why and stated that he did not want to stand up and would give me no explanation.  Cranial nerve exam appeared to be grossly intact.  ASSESSMENT: 1. Traumatic brain injury with polytrauma, bilateral orbital fractures     and mandibular fractures. 2. Reactive depression. 3. Attention deficit issues.  PLAN: 1. Wife reports a history of depression in the past associated with     some spells where he "blacked out."  I questioned whether something     similar could be happening here.  It do not sound to be seizure     episodes given that he is not postictal and he is cogniscent of     what is going on around him when these happen.  I would like to     check a CT of his head to rule out any new findings, particularly     hydrocephalus, however. 2. I think these perceived changes are more behavioral and associated     with depression and increased awareness of some of his deficits and     life now as it stands compared to his life before the brain injury.     I will start him on Lexapro 10 mg nightly and increase his Ritalin  at 10 mg in the morning and afternoon. 3. Consider Endocrine lab workup next month.  It's a little early to     check those at this point. 4. The patient was encouraged to seek adequate nutritional intake.  He     seems to be doing well there since going home. 5. I will see him back here in about a month.  I asked him to call me     with any questions.  The same applies to his therapy team also.     Ranelle Oyster, M.D. Electronically Signed    ZTS/MedQ D:  05/16/2011 11:59:35  T:  05/16/2011 23:19:16  Job #:  [id number]

## 2011-05-20 ENCOUNTER — Ambulatory Visit: Payer: Medicaid Other | Admitting: Occupational Therapy

## 2011-05-20 ENCOUNTER — Ambulatory Visit: Payer: Medicaid Other | Admitting: Physical Therapy

## 2011-05-20 ENCOUNTER — Ambulatory Visit: Payer: Medicaid Other | Admitting: *Deleted

## 2011-05-24 ENCOUNTER — Inpatient Hospital Stay: Payer: Self-pay | Admitting: Physical Medicine & Rehabilitation

## 2011-05-25 ENCOUNTER — Encounter: Payer: Medicaid Other | Admitting: Occupational Therapy

## 2011-05-25 ENCOUNTER — Ambulatory Visit: Payer: Medicaid Other | Admitting: Physical Therapy

## 2011-05-30 ENCOUNTER — Emergency Department (HOSPITAL_COMMUNITY): Payer: Medicaid Other

## 2011-05-30 ENCOUNTER — Emergency Department (HOSPITAL_COMMUNITY)
Admission: EM | Admit: 2011-05-30 | Discharge: 2011-05-30 | Disposition: A | Discharge status: Home / Self Care | Payer: Medicaid Other | Attending: Emergency Medicine | Admitting: Emergency Medicine

## 2011-05-30 DIAGNOSIS — R0989 Other specified symptoms and signs involving the circulatory and respiratory systems: Secondary | ICD-10-CM

## 2011-05-30 DIAGNOSIS — R209 Unspecified disturbances of skin sensation: Secondary | ICD-10-CM | POA: Insufficient documentation

## 2011-05-30 DIAGNOSIS — R29898 Other symptoms and signs involving the musculoskeletal system: Secondary | ICD-10-CM | POA: Insufficient documentation

## 2011-05-30 DIAGNOSIS — Z87820 Personal history of traumatic brain injury: Secondary | ICD-10-CM | POA: Insufficient documentation

## 2011-05-30 MED ORDER — GADOBENATE DIMEGLUMINE 529 MG/ML IV SOLN
15.0000 mL | Freq: Once | INTRAVENOUS | Status: AC
  Administered 2011-05-30: 15 mL via INTRAVENOUS

## 2011-05-31 ENCOUNTER — Ambulatory Visit: Payer: Medicaid Other | Admitting: Physical Therapy

## 2011-05-31 ENCOUNTER — Encounter: Payer: Medicaid Other | Admitting: Occupational Therapy

## 2011-06-02 ENCOUNTER — Ambulatory Visit: Payer: Medicaid Other | Admitting: Physical Therapy

## 2011-06-02 ENCOUNTER — Encounter: Payer: Medicaid Other | Admitting: Occupational Therapy

## 2011-06-07 ENCOUNTER — Encounter: Payer: Medicaid Other | Admitting: Occupational Therapy

## 2011-06-07 ENCOUNTER — Ambulatory Visit: Payer: Medicaid Other | Admitting: Physical Therapy

## 2011-06-17 ENCOUNTER — Encounter: Payer: Medicaid Other | Attending: Physical Medicine & Rehabilitation | Admitting: Physical Medicine & Rehabilitation

## 2011-06-17 DIAGNOSIS — F341 Dysthymic disorder: Secondary | ICD-10-CM | POA: Insufficient documentation

## 2011-06-17 DIAGNOSIS — R29898 Other symptoms and signs involving the musculoskeletal system: Secondary | ICD-10-CM | POA: Insufficient documentation

## 2011-06-17 DIAGNOSIS — S069XAS Unspecified intracranial injury with loss of consciousness status unknown, sequela: Secondary | ICD-10-CM | POA: Insufficient documentation

## 2011-06-17 DIAGNOSIS — F329 Major depressive disorder, single episode, unspecified: Secondary | ICD-10-CM

## 2011-06-17 DIAGNOSIS — G4700 Insomnia, unspecified: Secondary | ICD-10-CM

## 2011-06-17 DIAGNOSIS — S069X9S Unspecified intracranial injury with loss of consciousness of unspecified duration, sequela: Secondary | ICD-10-CM | POA: Insufficient documentation

## 2011-06-17 DIAGNOSIS — R41840 Attention and concentration deficit: Secondary | ICD-10-CM | POA: Insufficient documentation

## 2011-06-17 DIAGNOSIS — X58XXXS Exposure to other specified factors, sequela: Secondary | ICD-10-CM | POA: Insufficient documentation

## 2011-06-17 DIAGNOSIS — S069X9A Unspecified intracranial injury with loss of consciousness of unspecified duration, initial encounter: Secondary | ICD-10-CM
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
## 2011-08-12 ENCOUNTER — Encounter: Payer: Medicaid Other | Attending: Physical Medicine & Rehabilitation | Admitting: Physical Medicine & Rehabilitation

## 2011-08-12 DIAGNOSIS — H9190 Unspecified hearing loss, unspecified ear: Secondary | ICD-10-CM | POA: Insufficient documentation

## 2011-08-12 DIAGNOSIS — R4586 Emotional lability: Secondary | ICD-10-CM | POA: Insufficient documentation

## 2011-08-12 DIAGNOSIS — R454 Irritability and anger: Secondary | ICD-10-CM | POA: Insufficient documentation

## 2011-08-12 DIAGNOSIS — Z87820 Personal history of traumatic brain injury: Secondary | ICD-10-CM | POA: Insufficient documentation

## 2011-08-12 DIAGNOSIS — S069XAA Unspecified intracranial injury with loss of consciousness status unknown, initial encounter: Secondary | ICD-10-CM

## 2011-08-12 DIAGNOSIS — F341 Dysthymic disorder: Secondary | ICD-10-CM | POA: Insufficient documentation

## 2011-08-12 DIAGNOSIS — F070 Personality change due to known physiological condition: Secondary | ICD-10-CM

## 2011-08-12 DIAGNOSIS — S069X9A Unspecified intracranial injury with loss of consciousness of unspecified duration, initial encounter: Secondary | ICD-10-CM
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
## 2011-09-13 ENCOUNTER — Ambulatory Visit (INDEPENDENT_AMBULATORY_CARE_PROVIDER_SITE_OTHER): Payer: Medicaid Other | Admitting: Family Medicine

## 2011-09-13 ENCOUNTER — Encounter: Payer: Self-pay | Admitting: Family Medicine

## 2011-09-13 DIAGNOSIS — Z23 Encounter for immunization: Secondary | ICD-10-CM

## 2011-09-13 DIAGNOSIS — S069X9A Unspecified intracranial injury with loss of consciousness of unspecified duration, initial encounter: Secondary | ICD-10-CM

## 2011-09-13 DIAGNOSIS — H9190 Unspecified hearing loss, unspecified ear: Secondary | ICD-10-CM | POA: Insufficient documentation
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
## 2011-09-13 NOTE — Patient Instructions (Signed)
Nice to meet you  Call if you have not heard about the ENT referral in 2 weeks

## 2011-09-13 NOTE — Assessment & Plan Note (Signed)
Likely related to cerumen impaction.  Will refer since canal opening is markedly narrowed after reconstructive surgery

## 2011-10-07 ENCOUNTER — Encounter: Payer: Self-pay | Admitting: Physical Medicine & Rehabilitation

## 2011-10-11 ENCOUNTER — Encounter: Payer: Self-pay | Admitting: Physical Medicine & Rehabilitation

## 2011-12-02 ENCOUNTER — Encounter: Payer: Medicaid Other | Attending: Physical Medicine & Rehabilitation | Admitting: Physical Medicine & Rehabilitation

## 2011-12-02 DIAGNOSIS — F070 Personality change due to known physiological condition: Secondary | ICD-10-CM

## 2011-12-02 DIAGNOSIS — S069XAA Unspecified intracranial injury with loss of consciousness status unknown, initial encounter: Secondary | ICD-10-CM | POA: Insufficient documentation

## 2011-12-02 DIAGNOSIS — S069X9A Unspecified intracranial injury with loss of consciousness of unspecified duration, initial encounter: Secondary | ICD-10-CM

## 2011-12-02 DIAGNOSIS — F341 Dysthymic disorder: Secondary | ICD-10-CM | POA: Insufficient documentation

## 2011-12-02 DIAGNOSIS — X58XXXA Exposure to other specified factors, initial encounter: Secondary | ICD-10-CM | POA: Insufficient documentation

## 2011-12-02 DIAGNOSIS — G2581 Restless legs syndrome: Secondary | ICD-10-CM | POA: Insufficient documentation

## 2011-12-02 DIAGNOSIS — H61309 Acquired stenosis of external ear canal, unspecified, unspecified ear: Secondary | ICD-10-CM | POA: Insufficient documentation

## 2011-12-03 NOTE — Assessment & Plan Note (Signed)
Gerald [name] is back regarding his traumatic brain injury.  I last saw him in October.  He saw Dr. Suszanne Conners for his stenosis in his right ear and he is supposed to be seeing a specialist at [facility] who put a stent in the external canal.  He has really backed off the Neurontin and using it occasionally at night for his restless leg type symptoms.  He stopped the Ritalin.  His wife wonders about increasing the Lexapro, although Weber states he is bored than anything else, does lack some energy. He denies pain at this point.  He is working around the house.  He helps take care of the kids, meals, etc.  He used to be active outside a lot and has not been able to do the winter months, etc.  REVIEW OF SYSTEMS:  Notable for the above.  Full 12-point review is in the written health and history section of the chart.  SOCIAL HISTORY:  The patient is married, living with his wife.  PHYSICAL EXAMINATION:  VITAL SIGNS:  Blood pressure is 110/59, pulse 63, respiratory rate 16, and he is saturating 97% on room air. GENERAL:  The patient is generally pleasant, alert. NEUROLOGIC:  He has mild left central seven still.  Strength is nearly normal at 5/5 on the left and right.  He may have a bit of hand intrinsic weakness on the left.  Sensory exam is 1+/5 in the left upper and lower extremities.  His balance is really excellent.  The only time he lost any balance is when he was standing on his left leg and closing in eyes.  Cognitively, I felt he had better insight and awareness.  His arousal level appeared to be adequate.  Speech is well formed and thoughtful.  He had good thought processing. HEART:  Regular. CHEST:  Clear. ABDOMEN:  Soft and nontender.  ASSESSMENT: 1. Traumatic brain injury with poly trauma. 2. 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 4. Restless legs syndrome.  PLAN: 1. We will change his Neurontin to 100 mg 1-2 at nighttime for the     symptoms.  He can take this  earlier in the evening as well to avoid     any morning "hangover." 2. Stay with Lexapro 10 mg for now.  I do not see that he is     depressed.  He does need to find some goal and purpose in his life.     Along those lines, we will set him up for neuropsych testing to     quantify his cognitive levels and help guide [place] in vocational     reentry. 3. Follow up per Dr. Suszanne Conners. 4. I will see him back here in about 6 months' time.  He will call me     back sooner if needed.     Ranelle Oyster, M.D. Electronically Signed    ZTS/MedQ D:  12/02/2011 14:06:46  T:  12/03/2011 01:14:00  Job #:  [id number]

## 2011-12-09 DIAGNOSIS — F079 Unspecified personality and behavioral disorder due to known physiological condition: Secondary | ICD-10-CM

## 2011-12-09 DIAGNOSIS — S069X5A Unspecified intracranial injury with loss of consciousness greater than 24 hours with return to pre-existing conscious level, initial encounter: Secondary | ICD-10-CM

## 2011-12-09 DIAGNOSIS — X58XXXA Exposure to other specified factors, initial encounter: Secondary | ICD-10-CM

## 2011-12-26 ENCOUNTER — Ambulatory Visit (INDEPENDENT_AMBULATORY_CARE_PROVIDER_SITE_OTHER): Payer: Medicaid Other | Admitting: Family Medicine

## 2011-12-26 VITALS — BP 116/78 | HR 80 | Temp 98.0°F | Ht 68.5 in | Wt 145.0 lb

## 2011-12-26 DIAGNOSIS — J069 Acute upper respiratory infection, unspecified: Secondary | ICD-10-CM

## 2011-12-26 NOTE — Progress Notes (Signed)
Subjective: The patient is a 31 y.o. year old male who presents today for cough/sneezing.  3-4 days of sneezing and dry cough.  Some post-nasal drip.  No fevers/chills, no SOB/CP, cough not productive.  No other symptoms.  Has not been trying anything for tx.  No sore throat.  Objective:  Filed Vitals:   12/26/11 1214  BP: 116/78  Pulse: 80  Temp: 98 F (36.7 C)   Gen: NAD HEENT: No adenopathy, TM clear b/l although right is somewhat hard to visualize.  Throat mildly erythematous with cobblestoning. CV: RRR Resp: CTABL, no focal findings  Assessment/Plan: Viral URI with cough secondary to post-nasal drip.  Symptomatic tx with benadryl and Sudafed plus OTC cough suppressant.  RTC if worsens or not better in 3-5 days.

## 2011-12-27 NOTE — Patient Instructions (Signed)
It was great to see you today.

## 2012-01-04 DIAGNOSIS — S069X5A Unspecified intracranial injury with loss of consciousness greater than 24 hours with return to pre-existing conscious level, initial encounter: Secondary | ICD-10-CM

## 2012-01-04 DIAGNOSIS — X58XXXA Exposure to other specified factors, initial encounter: Secondary | ICD-10-CM

## 2012-01-04 DIAGNOSIS — F079 Unspecified personality and behavioral disorder due to known physiological condition: Secondary | ICD-10-CM

## 2012-01-06 DIAGNOSIS — X58XXXA Exposure to other specified factors, initial encounter: Secondary | ICD-10-CM

## 2012-01-06 DIAGNOSIS — S069X5A Unspecified intracranial injury with loss of consciousness greater than 24 hours with return to pre-existing conscious level, initial encounter: Secondary | ICD-10-CM

## 2012-01-06 DIAGNOSIS — F079 Unspecified personality and behavioral disorder due to known physiological condition: Secondary | ICD-10-CM

## 2012-01-17 DIAGNOSIS — X58XXXA Exposure to other specified factors, initial encounter: Secondary | ICD-10-CM

## 2012-01-17 DIAGNOSIS — S069X5A Unspecified intracranial injury with loss of consciousness greater than 24 hours with return to pre-existing conscious level, initial encounter: Secondary | ICD-10-CM

## 2012-01-17 DIAGNOSIS — F4321 Adjustment disorder with depressed mood: Secondary | ICD-10-CM

## 2012-01-17 DIAGNOSIS — F079 Unspecified personality and behavioral disorder due to known physiological condition: Secondary | ICD-10-CM

## 2012-01-31 ENCOUNTER — Other Ambulatory Visit: Payer: Self-pay | Admitting: *Deleted

## 2012-01-31 DIAGNOSIS — S069X9A Unspecified intracranial injury with loss of consciousness of unspecified duration, initial encounter: Secondary | ICD-10-CM

## 2012-02-22 ENCOUNTER — Encounter: Payer: Medicaid Other | Attending: Physical Medicine & Rehabilitation | Admitting: Physical Medicine & Rehabilitation

## 2012-02-22 ENCOUNTER — Encounter: Payer: Self-pay | Admitting: Physical Medicine & Rehabilitation

## 2012-02-22 DIAGNOSIS — F329 Major depressive disorder, single episode, unspecified: Secondary | ICD-10-CM

## 2012-02-22 DIAGNOSIS — H9190 Unspecified hearing loss, unspecified ear: Secondary | ICD-10-CM

## 2012-02-22 DIAGNOSIS — X58XXXA Exposure to other specified factors, initial encounter: Secondary | ICD-10-CM | POA: Insufficient documentation

## 2012-02-22 DIAGNOSIS — H61309 Acquired stenosis of external ear canal, unspecified, unspecified ear: Secondary | ICD-10-CM | POA: Insufficient documentation

## 2012-02-22 DIAGNOSIS — F341 Dysthymic disorder: Secondary | ICD-10-CM | POA: Insufficient documentation

## 2012-02-22 DIAGNOSIS — G2581 Restless legs syndrome: Secondary | ICD-10-CM | POA: Insufficient documentation

## 2012-02-22 DIAGNOSIS — S069XAA Unspecified intracranial injury with loss of consciousness status unknown, initial encounter: Secondary | ICD-10-CM | POA: Insufficient documentation

## 2012-02-22 DIAGNOSIS — S069X9A Unspecified intracranial injury with loss of consciousness of unspecified duration, initial encounter: Secondary | ICD-10-CM | POA: Insufficient documentation

## 2012-02-22 DIAGNOSIS — F32A Depression, unspecified: Secondary | ICD-10-CM | POA: Insufficient documentation

## 2012-02-22 NOTE — Progress Notes (Signed)
  Subjective:    Patient ID: Gerald [name], male    DOB: [birth year], 31 y.o.   MRN: [id number]  HPI  Gerald [name] is back regarding his TBI and pain issues.  He finds that 200mg  of neurontin helps his leg symptoms.  He sometimes takes 300mg .  He feels that his leg sx are better when he's more active on that given day.  He's not getting any hangover-like symptoms from the neurontin anymore.  His mood has been quite good with the lexapro.  He's doing a lot more around the house.  He's done some auto work as well, including fixing the starter on the family car.  He had the surgery on his right ear to remove the scar tissue and open up his external canal.  Left arm is feeling better as well with less numbness reported.  He is doing some short dx driving with his wife. No problems have been reported. He wants to find some part time work and is in the process of looking.  Pain Inventory Average Pain 0 Pain Right Now 0 My pain is n/a  In the last 24 hours, has pain interfered with the following? General activity n/a Relation with others n/a Enjoyment of life n/a What TIME of day is your pain at its worst? n/a Sleep (in general) Good  Pain is worse with: n/a Pain improves with: n/a Relief from Meds: n/a  Mobility walk without assistance ability to climb steps?  yes do you drive?  yes  Function not employed: date last employed 03/14/2011  Neuro/Psych No problems in this area  Prior Studies Any changes since last visit?  no  Physicians involved in your care Any changes since last visit?  no      Review of Systems  All other systems reviewed and are negative.       Objective:   Physical Exam  Constitutional: He appears well-developed and well-nourished.  HENT:  Head: Normocephalic and atraumatic.  Nose: Nose normal.  Mouth/Throat: Oropharynx is clear and moist.  Eyes: Conjunctivae and EOM are normal. Pupils are equal, round, and reactive to light.  Neck: Normal range of  motion.  Cardiovascular: Normal rate and regular rhythm.   Pulmonary/Chest: Effort normal and breath sounds normal.  Abdominal: Soft. Bowel sounds are normal.  Musculoskeletal: Normal range of motion.  Psychiatric: He has a normal mood and affect. His behavior is normal. Judgment and thought content normal.          Assessment & Plan:  ASSESSMENT:  1. Traumatic brain injury with poly trauma.  2. Right ear external canal stenosis.  3. Reactive depression.  4. Restless legs syndrome.   PLAN:  1. Continue low dose neurontin 100-300mg  qhs for leg sx  2. Stay with Lexapro 10 mg for now. His mood has improved quite a bit.  I would like to keep this going for now.  Consider dc in the fall or summer 3. Continue per ENT recs for the right ear 4. I will see him back here in about 6 months' time. He will call me  back sooner if needed 5. I gave him permission to drive locally during the day after a couple more dry runs with his family.  Overall Gerald [name] has done remarkably well.

## 2012-02-22 NOTE — Patient Instructions (Signed)
You may drive during the day time as tolerated.  I would like you to do a couple more "dry runs" with your family before you start driving alone.   Keep up the good work!

## 2012-03-30 ENCOUNTER — Ambulatory Visit: Payer: Medicaid Other | Admitting: Physical Medicine & Rehabilitation
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
## 2012-08-24 ENCOUNTER — Encounter: Payer: Medicaid Other | Attending: Physical Medicine & Rehabilitation | Admitting: Physical Medicine & Rehabilitation

## 2012-11-12 ENCOUNTER — Telehealth: Payer: Self-pay | Admitting: *Deleted

## 2012-11-12 ENCOUNTER — Encounter: Payer: Self-pay | Admitting: Physical Medicine & Rehabilitation

## 2012-11-12 ENCOUNTER — Encounter: Payer: Self-pay | Attending: Physical Medicine & Rehabilitation | Admitting: Physical Medicine & Rehabilitation

## 2012-11-12 VITALS — BP 122/74 | HR 56 | Resp 14 | Ht 69.5 in | Wt 151.0 lb

## 2012-11-12 DIAGNOSIS — S069XAA Unspecified intracranial injury with loss of consciousness status unknown, initial encounter: Secondary | ICD-10-CM | POA: Insufficient documentation

## 2012-11-12 DIAGNOSIS — H61309 Acquired stenosis of external ear canal, unspecified, unspecified ear: Secondary | ICD-10-CM | POA: Insufficient documentation

## 2012-11-12 DIAGNOSIS — S069X9A Unspecified intracranial injury with loss of consciousness of unspecified duration, initial encounter: Secondary | ICD-10-CM

## 2012-11-12 DIAGNOSIS — F341 Dysthymic disorder: Secondary | ICD-10-CM | POA: Insufficient documentation

## 2012-11-12 DIAGNOSIS — X58XXXA Exposure to other specified factors, initial encounter: Secondary | ICD-10-CM | POA: Insufficient documentation

## 2012-11-12 DIAGNOSIS — G2581 Restless legs syndrome: Secondary | ICD-10-CM | POA: Insufficient documentation

## 2012-11-12 NOTE — Telephone Encounter (Signed)
Advised patient he needs to be seen in order for letter to be written.  He does not have insurance and will try to work this out.

## 2012-11-12 NOTE — Telephone Encounter (Signed)
Patient was calling to get release letter from Dr. Riley Kill. Needs for his new job. Please fax to [phone]
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
## 2012-11-12 NOTE — Patient Instructions (Signed)
CALL ME WITH QUESTIONS 

## 2015-01-25 ENCOUNTER — Ambulatory Visit (INDEPENDENT_AMBULATORY_CARE_PROVIDER_SITE_OTHER): Payer: Self-pay | Admitting: Emergency Medicine

## 2015-01-25 VITALS — BP 120/62 | HR 101 | Temp 98.0°F | Resp 16 | Ht 68.0 in | Wt 156.0 lb

## 2015-01-25 DIAGNOSIS — Z029 Encounter for administrative examinations, unspecified: Secondary | ICD-10-CM

## 2015-01-25 NOTE — Progress Notes (Signed)
[facility] [address], [place] [id number] [phone]  Date:  01/25/2015   Name:  Gerald [name]   DOB:  12/07/1980   MRN:  [id number]  PCP:  Jacquiline DoeParker, Caleb, MD    Chief Complaint: DOT Physical   History of Present Illness:  Gerald [name] is a 34 y.o. very pleasant male patient who presents with the following:  DOT   Patient Active Problem List   Diagnosis Date Noted  . Depression 02/22/2012  . Restless leg 02/22/2012  . Hearing loss 09/13/2011  . Traumatic brain injury 09/13/2011    Past Medical History  Diagnosis Date  . Spinal cord lesion     post traumatic    Past Surgical History  Procedure Laterality Date  . Facial reconstructive  2012  . Inner ear surgery      2013    History  Substance Use Topics  . Smoking status: Current Every Day Smoker    Types: Cigarettes  . Smokeless tobacco: Former NeurosurgeonUser    Types: Snuff    Quit date: 02/22/2011  . Alcohol Use: No    History reviewed. No pertinent family history.  No Known Allergies  Medication list has been reviewed and updated.  Current Outpatient Prescriptions on File Prior to Visit  Medication Sig Dispense Refill  . acetaminophen (TYLENOL) 325 MG tablet Take 650 mg by mouth as needed.    Marland Kitchen. ibuprofen (ADVIL,MOTRIN) 200 MG tablet Take 200 mg by mouth as needed.     No current facility-administered medications on file prior to visit.    Review of Systems:  As per HPI, otherwise negative.    Physical Examination: Filed Vitals:   01/25/15 1445  BP: 120/62  Pulse: 101  Temp: 98 F (36.7 C)  Resp: 16   Filed Vitals:   01/25/15 1445  Height: 5\' 8"  (1.727 m)  Weight: 156 lb (70.761 kg)   Body mass index is 23.73 kg/(m^2). Ideal Body Weight: Weight in (lb) to have BMI = 25: 164.1  GEN: WDWN, NAD, Non-toxic, A & O x 3 HEENT: Atraumatic, Normocephalic. Neck supple. No masses, No LAD. Ears and Nose: No external deformity. CV: RRR, No M/G/R. No JVD. No thrill. No  extra heart sounds. PULM: CTA B, no wheezes, crackles, rhonchi. No retractions. No resp. distress. No accessory muscle use. ABD: S, NT, ND, +BS. No rebound. No HSM. EXTR: No c/c/e NEURO Normal gait.  PSYCH: Normally interactive. Conversant. Not depressed or anxious appearing.  Calm demeanor.    Assessment and Plan: DOT   Signed,  Phillips OdorJeffery Imad Shostak, MD
